# Patient Record
Sex: Female | Born: 1945 | Race: White | Hispanic: No | Marital: Married | Smoking: Current every day smoker
Health system: Southern US, Community
[De-identification: ages and names within clinical notes are randomized; demographics above are authoritative.]

## PROBLEM LIST (undated history)

## (undated) DIAGNOSIS — E119 Type 2 diabetes mellitus without complications: Secondary | ICD-10-CM

## (undated) DIAGNOSIS — G629 Polyneuropathy, unspecified: Secondary | ICD-10-CM

## (undated) DIAGNOSIS — G894 Chronic pain syndrome: Secondary | ICD-10-CM

## (undated) DIAGNOSIS — K219 Gastro-esophageal reflux disease without esophagitis: Secondary | ICD-10-CM

## (undated) DIAGNOSIS — Z720 Tobacco use: Secondary | ICD-10-CM

## (undated) DIAGNOSIS — E785 Hyperlipidemia, unspecified: Secondary | ICD-10-CM

## (undated) DIAGNOSIS — I2510 Atherosclerotic heart disease of native coronary artery without angina pectoris: Secondary | ICD-10-CM

## (undated) DIAGNOSIS — J449 Chronic obstructive pulmonary disease, unspecified: Secondary | ICD-10-CM

## (undated) HISTORY — DX: Type 2 diabetes mellitus without complications: E11.9

## (undated) HISTORY — DX: Polyneuropathy, unspecified: G62.9

## (undated) HISTORY — DX: Tobacco use: Z72.0

## (undated) HISTORY — DX: Hyperlipidemia, unspecified: E78.5

## (undated) HISTORY — DX: Gastro-esophageal reflux disease without esophagitis: K21.9

## (undated) HISTORY — DX: Chronic pain syndrome: G89.4

## (undated) HISTORY — DX: Atherosclerotic heart disease of native coronary artery without angina pectoris: I25.10

## (undated) HISTORY — DX: Chronic obstructive pulmonary disease, unspecified: J44.9

---

## 1997-12-13 ENCOUNTER — Other Ambulatory Visit: Admission: RE | Admit: 1997-12-13 | Discharge: 1997-12-13 | Payer: Self-pay

## 1999-10-27 ENCOUNTER — Ambulatory Visit (HOSPITAL_COMMUNITY): Admission: RE | Admit: 1999-10-27 | Discharge: 1999-10-27 | Payer: Self-pay | Admitting: Gastroenterology

## 1999-10-30 ENCOUNTER — Encounter: Payer: Self-pay | Admitting: Gastroenterology

## 1999-10-30 ENCOUNTER — Ambulatory Visit (HOSPITAL_COMMUNITY): Admission: RE | Admit: 1999-10-30 | Discharge: 1999-10-30 | Payer: Self-pay | Admitting: Gastroenterology

## 2000-01-05 ENCOUNTER — Other Ambulatory Visit: Admission: RE | Admit: 2000-01-05 | Discharge: 2000-01-05 | Payer: Self-pay | Admitting: *Deleted

## 2000-01-30 ENCOUNTER — Observation Stay (HOSPITAL_COMMUNITY): Admission: RE | Admit: 2000-01-30 | Discharge: 2000-01-31 | Payer: Self-pay | Admitting: Specialist

## 2000-01-30 ENCOUNTER — Encounter (INDEPENDENT_AMBULATORY_CARE_PROVIDER_SITE_OTHER): Payer: Self-pay | Admitting: Specialist

## 2000-01-30 ENCOUNTER — Encounter: Payer: Self-pay | Admitting: Specialist

## 2002-09-22 ENCOUNTER — Encounter: Payer: Self-pay | Admitting: Internal Medicine

## 2002-09-22 ENCOUNTER — Encounter: Payer: Self-pay | Admitting: Radiology

## 2002-09-22 ENCOUNTER — Encounter: Admission: RE | Admit: 2002-09-22 | Discharge: 2002-09-22 | Payer: Self-pay | Admitting: Internal Medicine

## 2002-10-10 ENCOUNTER — Encounter: Admission: RE | Admit: 2002-10-10 | Discharge: 2002-10-10 | Payer: Self-pay | Admitting: Internal Medicine

## 2002-10-10 ENCOUNTER — Encounter: Payer: Self-pay | Admitting: Internal Medicine

## 2002-11-07 ENCOUNTER — Encounter: Payer: Self-pay | Admitting: Internal Medicine

## 2002-11-07 ENCOUNTER — Encounter: Admission: RE | Admit: 2002-11-07 | Discharge: 2002-11-07 | Payer: Self-pay | Admitting: Internal Medicine

## 2003-03-27 HISTORY — PX: PERCUTANEOUS CORONARY STENT INTERVENTION (PCI-S): SHX6016

## 2008-11-12 ENCOUNTER — Ambulatory Visit (HOSPITAL_COMMUNITY): Admission: RE | Admit: 2008-11-12 | Discharge: 2008-11-12 | Payer: Self-pay | Admitting: Internal Medicine

## 2010-05-01 LAB — GLUCOSE, CAPILLARY: Glucose-Capillary: 196 mg/dL — ABNORMAL HIGH (ref 70–99)

## 2010-06-13 NOTE — Procedures (Signed)
[facility]  Patient:    Elaine Henson, Elaine Henson                     MRN: [id number] Proc. Date: 10/27/99 Adm. Date:  [id number] Attending:  Louie Bun CC:         Monica Becton, M.D.   Procedure Report  PROCEDURE PERFORMED:  Esophagogastroduodenoscopy.  INDICATIONS FOR PROCEDURE:  Abdominal pain with negative hepatobiliary work-up and failure to respond to proton pump inhibitor.  DESCRIPTION OF PROCEDURE:  The patient was placed in the left lateral decubitus position and placed on the pulse monitor with continuous low-flow oxygen delivered by nasal cannula.  She was sedated with 50 mg of IV Demerol and 5 mg of IV Versed.  The Olympus video endoscope was advanced under direct vision into the oropharynx and esophagus.  The esophagus was straight and of normal caliber at the squamocolumnar line at 38 cm.  There was no visible hiatal hernia, ring stricture, or other abnormality of the GE junction.  The stomach was entered and a small amount of liquid secretions were suctioned from the fundus.  Retroflexed view of the cardia was unremarkable.  The fundus, body, antrum and pylorus all appeared normal.  The duodenum was entered and both bulb and second portion were well inspected and appeared to be within normal limits.  The scope was then withdrawn and the patient returned to the recovery room in stable condition.  She tolerated the procedure well and there were no immediate complications.  IMPRESSION:  Essentially normal endoscopy.  PLAN:  Given the association of right leg pain and weakness, with her abdominal pain we will obtain an MRI of the spine.  If this is unrevealing we will consider possible PIPIDA scan given her that she had a small echogenic area thought to represent a small amount of sludge on her ultrasound. DD:  10/27/99 TD:  10/28/99 Job: [id number]

## 2010-06-13 NOTE — H&P (Signed)
[facility]  Patient:    Elaine Henson, Elaine Henson                       MRN: [id number] Adm. Date:  01/30/00 Attending:  Javier Docker, M.D. Dictator:   Dorie Rank, P.A.-C. CC:         Dr. Montey Hora, [place], [place].   History and Physical  DATE OF BIRTH:  1945-04-12  CHIEF COMPLAINT:  Right lower extremity and back pain.  HISTORY OF PRESENT ILLNESS:  Ms. [name] has been having back pain which started suddenly on September 24 without any known injury. She was seen and evaluated in our office after being referred by her family medical doctor in [place], [place], Dr. Montey Hora. She has undergone trials of epidural steroid injections at the L5-S1 interspace on my right and did only get minimal relief. She was tried on nonsteroids and various types of pain medicine which proved to relieve her pain. She had an MRI on October 30, 1999 which revealed right lower extremity radiculopathy secondary to neuroforaminal stenosis at l5 on my right. Also underlying degenerative disk disease at L5-S1. Neuroforaminal narrowing at L4-5 and L5-S1 on the left and a small parous central disk herniation at L5-S1 on the left.  On physical exam, it was noted that she walks with an antalgic gait. She had some discomfort with end forward flexion as well as with end extension. She was nontender at the trochanters. Straight leg raise on my right produces marked buttocks, posterior thigh and calf pain which is exacerbated with dorsilog mentation maneuvers. She had slight groin pain with internal external rotation of her head, diminished sensation at the L5 dermatome. ______ this Babinski or clonus. Motor was 5/5. Sensation was increased slightly at the L5 dermatome. The pain was getting to where it was inferring with her activities of daily living and she could no longer go to work due to the pain in her back. After a long discussion of the pathology and real anatomy and  treatment options and given the persistence of her symptoms, it was mutually decided to proceed with a lumbar decompression at L5-S1 to include foraminotomy and L5 evaluation and diskectomy of L5-S1. The risks and benefits were discussed and the procedure was described in detail. She agreed to proceed and the surgery was scheduled.  PAST MEDICAL HISTORY:  History of anxiety and depression. Diabetes mellitus type 2 diagnosed x approximately 1 year ago. History of urinary incontinence. The patient says she has gastroesophageal reflux disease and she has been evaluated by a gastroenterologist, Dr. Madilyn Fireman for her recent weight loss in the past 6 months. She does have a history of arthritis in her low back. History of emphysema and hypercholesterolemia. The patient states she has had hepatomegaly which her medical doctor has followed up on ultrasound with. She states that her liver enzymes have been normal although we do not have those today.  SOCIAL HISTORY:  The patient is engaged. She denies any alcohol use. She smokes approximately a 1/2 a pack per day. Prior to September 24, she worked at [employer] in [place], [place]. She has 2 children and 1 grandchild who she takes care of.  PAST SURGICAL HISTORY:  1990 hysterectomy, 1989 rotator cuff repair, 1978 tubal ligation.  ALLERGIES:  No known drug allergies.  MEDICATIONS:  Premarin 0.65 mg 1 p.o. q.d., Nexium 40 mg 1 p.o. q.d., lorazepam 0.5 mg 1 p.o. q. 6h p.r.n. anxiety,  amaryl 2 mg 1 p.o. q.d.  FAMILY HISTORY:  Mother deceased at age 68 history of colon cancer, father deceased at age 57 gunshot wounds, does have a history of heart disease in 3 of her brothers.  REVIEW OF SYSTEMS:  HEENT:  Positive for does have some occasional frontal and parietal headaches. No otorrhea, no rhinorrhea. No sore throat. CARDIOVASCULAR:  Occasional "skipped beat", negative for angina, palpations. PULMONARY:  Does have shortness  of breath and dyspnea on exertion. States that she does wheeze. ENDOCRINE: The patient is a type 2 diabetic x 1 year, no hypo or hyper thyroidism. CENTRAL NERVOUS SYSTEM:  No dizziness, vertigo, history of strokes, facial drooping or seizures. GASTROINTESTINAL:  Does have some diarrhea. No constipation, no melena, no hematochezia, no hematemesis. GENITOURINARY:  Does have a history of urinary incontinence. No dysuria or increase in frequency. No urinary tract infections, no hematuria. MUSCULOSKELETAL:  Does have arthritis of the low back. No other joint pain or weakness, does have some numbness and tingling to the right leg as described above.  PHYSICAL EXAMINATION:  GENERAL:  A 65 year old white female with a depressed affect.  VITAL SIGNS:  Pulse 78, respirations 20, blood pressure 110/60.  HEENT:  Head is normocephalic, atraumatic. The patient is wearing glasses today. Her oropharynx is not erythematous. PERRLA. Extraocular movements intact.  NECK:  Supple, negative for carotid bruits.  CHEST:  Lungs are clear to auscultation bilaterally. Negative for wheezes, rhonchi or rales.  HEART: S1, S2, grade 3/6 systolic ejection murmur ______ pulmonic and aortic area.  BREASTS/GENITOURINARY:  Not pertinent to present illness.  ABDOMEN:  There is some mild epigastric tenderness noted on palpitations. Positive bowel sounds. Abdomen is soft and round.  EXTREMITIES:  2+ dorsalis pedis pulse bilaterally, 2+ posterior tibialis pulses bilaterally. Please see history of present illness for description of the physical exam to the lower extremities.  SKIN:  Acyanotic, no clubbing noted. There is a small healing ulcer to the dorsum of the left hand approximately 2 mm x 2 mm with no bleeding or oozing or erythema noted to the area.  LABORATORY DATA AND X-RAYS:  Preop labs and x-rays are pending at this time.  IMPRESSION:  1. Herniated nucleus pulposus L5-S1, spinal stenosis.  2. Anxiety  and depression.  3. Diabetes mellitus type 2.   4. Urinary incontinence.  5. Gastroesophageal reflux disease.  6. Arthritis.  7. Hepatomegaly.  8. Emphysema.  9. Hypercholesterolemia. 10. History of heart murmur.  PLAN:  The patient is scheduled for a bilateral hemilaminectomy with microdiskectomy. L5-S1 foraminotomy L5 on the right with Dr. Jene Every on January 30, 2000 at [facility]. DD:  01/26/00 TD:  01/26/00 Job: [id number]

## 2010-06-13 NOTE — Op Note (Signed)
[facility]  Patient:    Elaine Henson, Elaine Henson                     MRN: [id number] Proc. Date: 01/30/00 Adm. Date:  [id number] Attending:  Pierce Crane                           Operative Report  PREOPERATIVE DIAGNOSES: 1. Herniated nucleus pulposus L5-S1 bilaterally. 2. Right L5 foraminal stenosis.  POSTOPERATIVE DIAGNOSES: 1. Herniated nucleus pulposus L5-S1 bilaterally. 2. Right L5 foraminal stenosis.  OPERATION: 1. Bilateral hemilaminotomy, 2. Bilateral microdiskectomy and foraminotomy L5 right.  SURGEON:  Javier Docker, M.D.  ANESTHESIA:  General.  BRIEF HISTORY AND INDICATIONS:  A 65 year old with bilateral lower extremity radiculopathy, right greater than left, HNP at L5-S1, foraminal stenosis at L5 on the right, lumbar spondylosis with associated disk degeneration.  Surgery is indicated to decompress the S1 nerve roots and L5 nerve roots on the right.  Risks and benefits of the procedure were discussed including bleeding, infection, damage to neurovascular structures, CSF leakage, no change in symptoms, worsening of symptoms, need for fusion in the future, no change in her back pain, discussed predominantly for leg pain.  TECHNIQUE:  The patient was placed in the supine position after induction of adequate general anesthesia with 1 g Kefzol IV for prophylaxis.  The patient was placed prone on the [place] frame.  All bony prominences were well padded.  The lumbar region was prepped and draped in the usual sterile fashion.  Two 18-gauge spinal needles were utilized to localize the L5-S1 interspace and confirmed with x-ray.  Decision was made from the spinous process of L5 to S1.  Subcutaneous tissue was dissected.  Electrocautery was utilized to achieve hemostasis.  First, the lumbar fascia was identified and divided in line with the skin incision.  Paraspinous muscles were gently elevated from the lamina of L5-S1 bilaterally.   McCullough retractors were placed.  The operating microscope was draped and brought into the surgical field.  Penfield 4 was placed in the interlaminar space, and this confirmed by x-ray the L5-S1 interspace.   High-speed bur was utilized to perform a hemilaminotomy of the L5 which was performed with 2 mm Kerrison.  Ligamentum flavum was detached from the cephalad edge of S1.  Hemilaminotomy of S1 was performed as was a foraminotomy.  Ligamentum flavum was removed from the interspace after neural elements were protected on all sides with a DErrico neural patties.   Small focal HNP was noted compressing the S1 nerve root.  The nerve root gently was retracted medially.  Annulotomy was performed.  Focal disruption was removed. Disk space was extremely degenerated.  I was unable to enter the disk space due to collapse.  The foramen of S1 and the axilla of S1 were without evidence of neural compression.  A foraminotomy was performed at L5 with 2 mm Kerrison after hockey stick probe revealed it was severely constricted, depressing the L5 root.  Extensive foraminotomy was performed with partial medial hemifacetectomy decompressing the L5 nerve root.  This was found to be erythematous and edematous.  This was felt to be a significant pathology.  The foraminotomy in this region was decompressed as optimally as possibly.  The wound was copiously irrigated.  Bipolar electrocautery was utilized to achieve hemostasis.  Thombin-soaked Gelfoam was placed in the laminotomy defect.  Attempted turn to the contralateral side with similar fashion.  The interlaminar  space was opened.  Hemilaminotomies at L5 and S1 were performed. S1 nerve root identified at the foramen, gently retracted medially.  Focal disk herniation noted.  Annulotomy performed and herniated disk material removed.  Again, the disk space was not able to be entered due to approximation of the disk space due to degeneration.  Bur placed in  the foramen at L5-S1 and found to be widely patent.  Residual nerve compression noted on left after the diskectomy.  Bipolar electrocautery utilized to achieve hemostasis.  Wound copiously irrigated.  No active bleeding or CSF leak.  Thrombin-soaked Gelfoam was placed in laminotomy defect.  Next, operating microscope was removed as was the [place].  Paraspinous muscles were checked with no active bleeding.  Dorsal lumbar fascia was reapproximated with #1 Vicryl figure-of-eight sutures.  Subcutaneous tissue was reapproximated with 2-0 Vicryl sutures.  Skin was reapproximated with  3-0 subcuticular Prolene.  Sterile dressing applied.   The patient was placed supine on the hospital bed, extubated without difficulty, and transported to the recovery room in satisfactory condition.  The patient tolerated the procedure well without complication.  Sponge, needle, and sharp counts correct.   Blood loss was minimal. DD:  01/30/00 TD:  01/30/00 Job: [id number]

## 2011-06-11 IMAGING — CT NM PET TUM IMG INITIAL (PI) SKULL BASE T - THIGH
6 series · 25 of 25 positions shown · IV contrast (350 OM)
Comparison: CT chest from [facility] [[facility]] dated
10/31/2008.

CLINICAL DATA: Initial treatment strategy for pulmonary nodule.

NUCLEAR MEDICINE PET CT INITIAL (PI) SKULL BASE TO THIGH
TECHNIQUE: 13.4 mCi F-18 FDG was injected intravenously via the
right antecubital fossa.  Full-ring PET imaging was performed from
the skull base through the mid-thighs 75  minutes after injection.
CT data was obtained and used for attenuation correction and
anatomic localization only.  (This was not acquired as a diagnostic
CT examination.)
Fasting Blood Glucose:  196

[Series 1: pet ac · axial · 3.3mm · 4.69mm/px · z∈[-868,+2]mm · 5 of 267 slices shown]
[im 1/267]
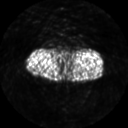
[im 67/267]
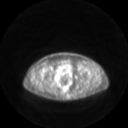
[im 134/267]
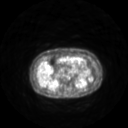
[im 200/267]
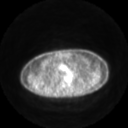
[im 267/267]
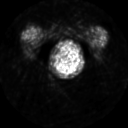

[Series 2: pet nac · axial · 3.3mm · 4.69mm/px · z∈[-868,+2]mm · 6 of 267 slices shown]
[im 1/267]
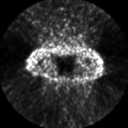
[im 54/267]
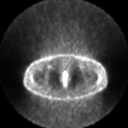
[im 107/267]
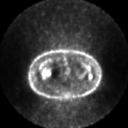
[im 160/267]
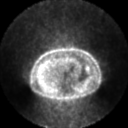
[im 213/267]
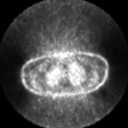
[im 267/267]
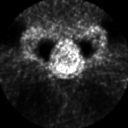

[Series 2: ct images · axial · 3.8mm · 0.98mm/px · z∈[-868,+2]mm · 5 of 267 slices shown]
[im 1/267]
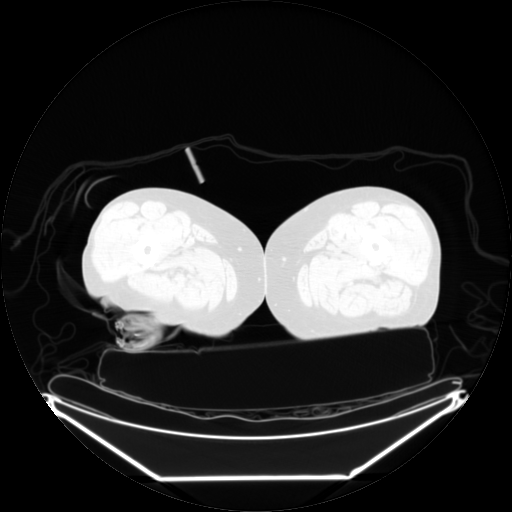
[im 67/267]
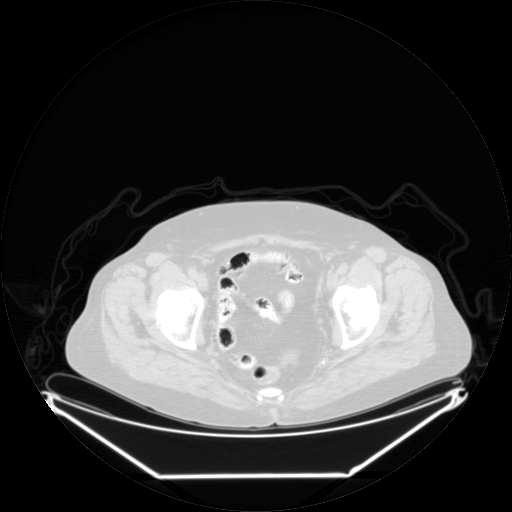
[im 134/267]
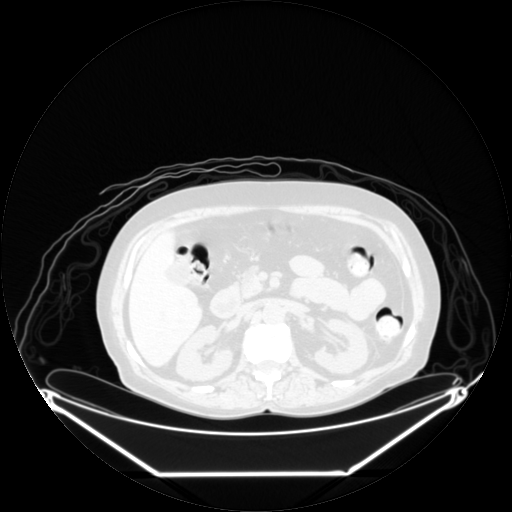
[im 200/267]
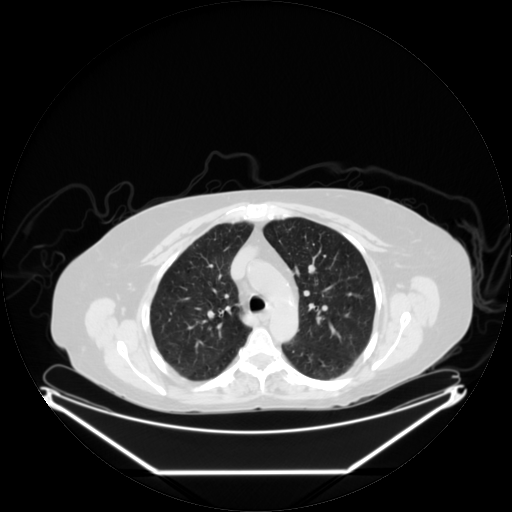
[im 267/267  brain]
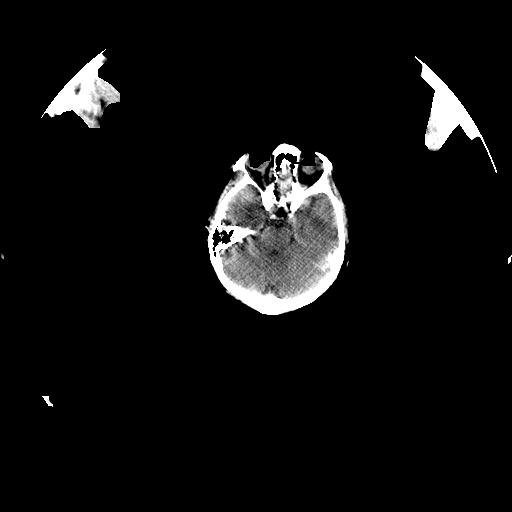

[Series 123: mip · coronal · 3.3mm · 4.69mm/px · 1 of 30 slices shown]
[im 1/30]
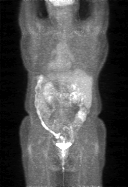

[Series 151: reformatted · axial · 3.3mm · 3.91mm/px · z∈[-868,+2]mm · 6 of 267 slices shown (1 of 2)]
[im 1/267]
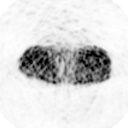
[im 54/267]
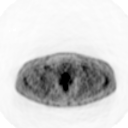
[im 107/267]
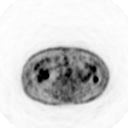
[im 160/267]
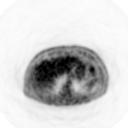
[im 213/267]
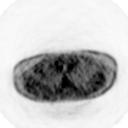
[im 267/267]
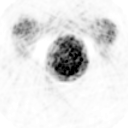

[Series 153: reformatted · coronal · 4.7mm · 6.98mm/px · 2 of 79 slices shown (2 of 2)]
[im 1/79]
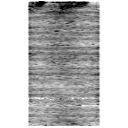
[im 79/79]
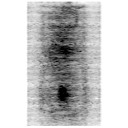

[25 of 25 positions shown; findings below may reference images not displayed]

FINDINGS: No suspicious or unexpected foci of F D G uptake are
identified in the neck, chest, abdomen, or pelvis.

Specifically, the 9 mm posterior left lower lobe pulmonary nodules
seen on the recent CT scan shows no discernible hypermetabolic
activity on today's PET CT.  While reassuring, the 9 mm size of
this nodule is borderline for accepted resolution by PET CT
imaging, especially if this represents a low grade or well
differentiated neoplasm.  As such, continued imaging follow-up is
recommended.

No discernible hypermetabolic activity can be identified within the
4 mm posterior right upper lobe pulmonary nodules seen on the
previous CT scan.  However, at this tiny size, this is well below
the accepted threshold for reliable resolution by PET CT imaging.

A small focus of F D G accumulation is seen in the region of the
urethra.  The patient has a demonstrable cystocele on the CT
imaging and the activity in the urethra may be related to
contaminants or a urethral diverticulum.  CT images obtained for
attenuation correction show coronary artery calcification.
IMPRESSION: No hypermetabolic activity is identified within either of the
pulmonary nodules identified on the recent chest CT at [facility]
[[facility]OS[facility]].  The larger of the two nodules is 9 mm which is
essentially borderline for reliable resolution by PET CT imaging.
While the lack of hypermetabolic activity in this nodule is
reassuring, nodules of this size can present with false negative
results, especially in the setting of a well differentiated or low
grade neoplasm.  CT of the chest in 3 months is recommended to
confirm size stability.  This study could be performed without
intravenous contrast material. The tiny right lung nodule could
also be reassessed at that time.

## 2015-06-13 ENCOUNTER — Encounter: Payer: Self-pay | Admitting: *Deleted

## 2015-06-20 ENCOUNTER — Ambulatory Visit: Payer: Self-pay | Admitting: Cardiology

## 2015-06-26 ENCOUNTER — Ambulatory Visit (INDEPENDENT_AMBULATORY_CARE_PROVIDER_SITE_OTHER): Payer: Medicare HMO | Admitting: Cardiovascular Disease

## 2015-06-26 ENCOUNTER — Encounter: Payer: Self-pay | Admitting: Cardiovascular Disease

## 2015-06-26 ENCOUNTER — Encounter: Payer: Self-pay | Admitting: *Deleted

## 2015-06-26 VITALS — BP 101/59 | HR 48 | Ht 66.0 in | Wt 127.0 lb

## 2015-06-26 DIAGNOSIS — E785 Hyperlipidemia, unspecified: Secondary | ICD-10-CM

## 2015-06-26 DIAGNOSIS — Z955 Presence of coronary angioplasty implant and graft: Secondary | ICD-10-CM

## 2015-06-26 DIAGNOSIS — I252 Old myocardial infarction: Secondary | ICD-10-CM

## 2015-06-26 DIAGNOSIS — I25118 Atherosclerotic heart disease of native coronary artery with other forms of angina pectoris: Secondary | ICD-10-CM

## 2015-06-26 DIAGNOSIS — Z136 Encounter for screening for cardiovascular disorders: Secondary | ICD-10-CM | POA: Diagnosis not present

## 2015-06-26 DIAGNOSIS — Z716 Tobacco abuse counseling: Secondary | ICD-10-CM

## 2015-06-26 NOTE — Patient Instructions (Signed)
Continue all current medications. Your physician wants you to follow up in: 6 months.  You will receive a reminder letter in the mail one-two months in advance.  If you don't receive a letter, please call our office to schedule the follow up appointment   

## 2015-06-26 NOTE — Progress Notes (Signed)
Patient ID: Elaine Henson, female   DOB: 28-May-1945, 70 y.o.   MRN: [id number]       CARDIOLOGY CONSULT NOTE  Patient ID: Elaine Henson MRN: [id number] DOB/AGE: March 14, 1945 70 y.o.  Admit date: (Not on file) Primary Physician: Ignatius Specking, MD Referring Physician: Sherril Croon MD  Reason for Consultation: CAD  HPI: The patient is a 70 year old woman with a history of coronary artery disease, non-STEMI, PCI, chronic tobacco abuse, and insulin-dependent diabetes mellitus.  She sustained a non ST segment elevation myocardial infarction in March 2005 (Cypher drug eluting stent to circumflex and POBA of OM2, borderline obstructive disease in LAD and borderline disease in RCA).   Nuclear myocardial perfusion study on 08/23/09 showed no evidence of ischemia or scar.  ECG performed in the office today which I personally reviewed demonstrates normal sinus bradycardia with no ischemic ST segment or T-wave abnormalities, nor any arrhythmias.  Lipids 11/24/14 showed total cholesterol 126, triglycerides 177, HDL 32, LDL 59.  She has had 2 episodes of chest pain requiring nitroglycerin use in the past 6 months. She said she had another heart attack 2 years ago and was hospitalized at [facility]. She says she has 3 stents and not 1, all of which were placed in 2005.  She has easy bruisability. She continues to smoke a half pack of cigarettes daily and has chronic exertional dyspnea related to this. She has been smoking since she was 70 years old. She complains of occasional night sweats. I do not have a copy of her cath report from 2005.  Allergies  Allergen Reactions  . Metformin And Related Diarrhea    Current Outpatient Prescriptions  Medication Sig Dispense Refill  . ALPRAZolam (XANAX) 1 MG tablet Take 1 mg by mouth 3 (three) times daily as needed.    Marland Kitchen aspirin EC 81 MG tablet Take 81 mg by mouth daily.    . benazepril (LOTENSIN) 5 MG tablet Take 5 mg by mouth daily.    . clopidogrel (PLAVIX) 75 MG  tablet Take 75 mg by mouth daily.    . Fluticasone-Salmeterol (ADVAIR DISKUS) 250-50 MCG/DOSE AEPB Inhale 1 puff into the lungs 2 (two) times daily.    Marland Kitchen glimepiride (AMARYL) 4 MG tablet Take 4 mg by mouth 2 (two) times daily.    Marland Kitchen HYDROcodone-acetaminophen (NORCO) 7.5-325 MG tablet Take 1 tablet by mouth 3 (three) times daily as needed for moderate pain.    . Insulin Detemir (LEVEMIR FLEXPEN) 100 UNIT/ML Pen Inject 40 Units into the skin daily.    . metoprolol tartrate (LOPRESSOR) 25 MG tablet Take 25 mg by mouth daily.    . mirabegron ER (MYRBETRIQ) 25 MG TB24 tablet Take 25 mg by mouth daily.    . nitroGLYCERIN (NITROSTAT) 0.4 MG SL tablet Place 0.4 mg under the tongue every 5 (five) minutes as needed for chest pain.    . pantoprazole (PROTONIX) 40 MG tablet Take 40 mg by mouth 2 (two) times daily.    . sitaGLIPtin (JANUVIA) 100 MG tablet Take 100 mg by mouth daily.     No current facility-administered medications for this visit.    Past Medical History  Diagnosis Date  . Neuropathy (HCC)   . GERD (gastroesophageal reflux disease)   . Diabetes (HCC)   . COPD (chronic obstructive pulmonary disease) (HCC)   . Tobacco abuse   . Hyperlipidemia   . Chronic pain syndrome   . CAD (coronary artery disease)     [facility] H&P: s/p non  ST segment elevation myocardial infarction in March, 2005 (Cypher drug eluting stent to circumflex and POBA of OM2, borderline obstructive disease in LAD end borderline disease in RCA) O escalation of angina    No past surgical history on file.  Social History   Social History  . Marital Status: Married    Spouse Name: N/A  . Number of Children: N/A  . Years of Education: N/A   Occupational History  . Not on file.   Social History Main Topics  . Smoking status: Current Every Day Smoker -- 0.75 packs/day    Types: Cigarettes  . Smokeless tobacco: Not on file  . Alcohol Use: Not on file  . Drug Use: Not on file  . Sexual Activity: Not on file    Other Topics Concern  . Not on file   Social History Narrative     No family history of premature CAD in 1st degree relatives.  Prior to Admission medications   Medication Sig Start Date End Date Taking? Authorizing Provider  ALPRAZolam Prudy Feeler(XANAX) 1 MG tablet Take 1 mg by mouth 3 (three) times daily as needed.    Historical Provider, MD  aspirin EC 81 MG tablet Take 81 mg by mouth daily.    Historical Provider, MD  benazepril (LOTENSIN) 5 MG tablet Take 5 mg by mouth daily.    Historical Provider, MD  clopidogrel (PLAVIX) 75 MG tablet Take 75 mg by mouth daily.    Historical Provider, MD  Fluticasone-Salmeterol (ADVAIR DISKUS) 250-50 MCG/DOSE AEPB Inhale 1 puff into the lungs 2 (two) times daily.    Historical Provider, MD  glimepiride (AMARYL) 4 MG tablet Take 4 mg by mouth 2 (two) times daily.    Historical Provider, MD  HYDROcodone-acetaminophen (NORCO) 7.5-325 MG tablet Take 1 tablet by mouth 3 (three) times daily as needed for moderate pain.    Historical Provider, MD  Insulin Detemir (LEVEMIR FLEXPEN) 100 UNIT/ML Pen Inject 40 Units into the skin daily.    Historical Provider, MD  metoprolol tartrate (LOPRESSOR) 25 MG tablet Take 25 mg by mouth daily.    Historical Provider, MD  mirabegron ER (MYRBETRIQ) 25 MG TB24 tablet Take 25 mg by mouth daily.    Historical Provider, MD  nitroGLYCERIN (NITROSTAT) 0.4 MG SL tablet Place 0.4 mg under the tongue every 5 (five) minutes as needed for chest pain.    Historical Provider, MD  pantoprazole (PROTONIX) 40 MG tablet Take 40 mg by mouth 2 (two) times daily. 12/19/12   Historical Provider, MD  sitaGLIPtin (JANUVIA) 100 MG tablet Take 100 mg by mouth daily.    Historical Provider, MD     Review of systems complete and found to be negative unless listed above in HPI     Physical exam Height 5\' 6"  (1.676 m), weight 127 lb (57.607 kg). General: NAD Neck: No JVD, no thyromegaly or thyroid nodule.  Lungs: Diminished throughout, no  rales/wheezes. CV: Nondisplaced PMI. Regular rate and rhythm, normal S1/S2, no S3/S4, no murmur.  No peripheral edema.   Abdomen: Soft, nontender, no distention.  Skin: Erythema of b/l LE's. Neurologic: Alert and oriented x 3.  Psych: Normal affect. HEENT: Normal.   ECG: Most recent ECG reviewed.  Labs:  No results found for: WBC, HGB, HCT, MCV, PLT No results for input(s): NA, K, CL, CO2, BUN, CREATININE, CALCIUM, PROT, BILITOT, ALKPHOS, ALT, AST, GLUCOSE in the last 168 hours.  Invalid input(s): LABALBU No results found for: CKTOTAL, CKMB, CKMBINDEX, TROPONINI No results found for:  CHOL No results found for: HDL No results found for: LDLCALC No results found for: TRIG No results found for: CHOLHDL No results found for: LDLDIRECT       Studies: No results found.  ASSESSMENT AND PLAN:  1. CAD with h/o NSTEMI and PCI of LCx: Stable ischemic heart disease. I will try to obtain a copy of her cath report from 2005 to verify number of stents. I will continue aspirin, Lipitor, benazepril, Plavix, and metoprolol.  2. Hyperlipidemia: Lipids from 10/2014 reviewed above. Continue Lipitor.  3. Tobacco abuse disorder: Unwilling to quit. Cessation counseling given (1 minute). Says she gets anxious if she doesn't smoke.  Dispo: fu 6 months.   Signed: Prentice Docker, M.D., F.A.C.C.  06/26/2015, 9:16 AM

## 2016-03-19 ENCOUNTER — Telehealth (HOSPITAL_COMMUNITY): Payer: Self-pay | Admitting: *Deleted

## 2016-03-19 NOTE — Telephone Encounter (Signed)
phone call to patient regarding an appointment, a female answered and when asked to speak with patient, he hung up the phone.

## 2016-04-02 ENCOUNTER — Telehealth: Payer: Self-pay | Admitting: Cardiovascular Disease

## 2016-04-02 NOTE — Telephone Encounter (Signed)
attempts to contact patient with recall letters. Unable to reach by telephone. with no success.    Megan SalonVicky T Slaughter [[id number]][[id number]] 06/26/2015 9:48 AM New [10]   [System] 08/27/2015 11:03 PM Notification Sent [20]   Megan SalonVicky T Slaughter [[id number]][[id number]] 03/24/2016 3:14 PM Notification Sent [20]   Megan SalonVicky T Slaughter [[id number]][[id number]] 04/02/2016 3:33 PM Notification Sent [20]  Scheduling Instructions  6 month

## 2016-05-28 NOTE — Progress Notes (Signed)
Psychiatric Initial Adult Assessment   Patient Identification: Elaine Henson MRN:  [id number] Date of Evaluation:  06/01/2016 Referral Source: [facility] Chief Complaint:   Chief Complaint    New Evaluation    "I'm upset"  Visit Diagnosis:    ICD-9-CM ICD-10-CM   1. Generalized anxiety disorder 300.02 F41.1     History of Present Illness:   Elaine Henson is a 71 year old female with CAD with history of NSTEMI and PCI, hyperlipidemia, tobacco use, who is referred for anxiety.   Patient states that she is here as she will not be prescribed on Xanax anymore by her PCP. She states that she feels upset as her brother's grandchild died last week due to drowning in a pool. Although she has never met this grandchild, she feels sad as she knows her he was important to her brother. She feels frustrated as she will not be able to afford to go to funeral in [place]. She talks about frustration of her son with TBI; she takes care of him and pay all the bills, although she has financial strain. She takes care of her grandson's ("my child") daughter on weekday.   She reports insomnia if she does not take Xanax. She reports good energy and denies anhedonia. She feels anxious all the time since childhood. She reports occasional palpitation with severe anxiety. She has difficulty in concentration due to anxiety. She takes Xanax three times per day for anxiety. She denies decreased need for sleep or euphoria. She reports occasional AH of noises. She drinks only on special occasion, and she denies drug use.   05/30/2016 HYDROCODONE- ACETAMIN 7.5- 325, 120 tabs for 30 days,   Sander Radon Manuela Neptune MD 05/11/2016 ALPRAZOLAM 1 MG TABLET, 90 tabs for 30 days,  BOONE ANGELA R  Associated Signs/Symptoms: Depression Symptoms:  insomnia, (Hypo) Manic Symptoms:  denies Anxiety Symptoms:  Excessive Worry, Psychotic Symptoms:  denies PTSD Symptoms: Negative  Past Psychiatric History:  Outpatient:  denies Psychiatry admission: denies Previous suicide attempt: denies Past trials of medication: fluoxetine (nausea) History of violence: denies  Previous Psychotropic Medications: Yes   Substance Abuse History in the last 12 months:  No.  Consequences of Substance Abuse: NA  Past Medical History:  Past Medical History:  Diagnosis Date  . CAD (coronary artery disease)    [facility] H&P: s/p non ST segment elevation myocardial infarction in March, 2005 (Cypher drug eluting stent to circumflex and POBA of OM2, borderline obstructive disease in LAD end borderline disease in RCA) O escalation of angina  . Chronic pain syndrome   . COPD (chronic obstructive pulmonary disease) ([place])   . Diabetes ([place])   . GERD (gastroesophageal reflux disease)   . Hyperlipidemia   . Neuropathy   . Tobacco abuse    No past surgical history on file.  Family Psychiatric History:  Denies,  Family History:  Family History  Problem Relation Age of Onset  . Colon cancer Mother     Social History:   Social History   Social History  . Marital status: Married    Spouse name: N/A  . Number of children: N/A  . Years of education: N/A   Social History Main Topics  . Smoking status: Current Every Day Smoker    Packs/day: 0.75    Types: Cigarettes    Start date: 04/28/1959  . Smokeless tobacco: Never Used  . Alcohol use None  . Drug use: Unknown  . Sexual activity: Not Asked  Other Topics Concern  . None   Social History Narrative  . 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 Metformin And Related Diarrhea    Metabolic Disorder Labs: No results found for: HGBA1C, MPG No results found for: PROLACTIN No results found for: CHOL, TRIG, HDL, CHOLHDL, VLDL,  LDLCALC   Current Medications: Current Outpatient Prescriptions  Medication Sig Dispense Refill  . albuterol (PROAIR HFA) 108 (90 Base) MCG/ACT inhaler Inhale 2 puffs into the lungs every 6 (six) hours as needed for wheezing or shortness of breath.    Marland Kitchen aspirin EC 81 MG tablet Take 81 mg by mouth daily.    Marland Kitchen atorvastatin (LIPITOR) 20 MG tablet Take 1 tablet by mouth daily.    . benazepril (LOTENSIN) 5 MG tablet Take 5 mg by mouth daily.    . calcium carbonate (OSCAL) 1500 (600 Ca) MG TABS tablet Take 600 mg of elemental calcium by mouth 2 (two) times daily with a meal.    . clopidogrel (PLAVIX) 75 MG tablet Take 75 mg by mouth daily.    . Fluticasone-Salmeterol (ADVAIR DISKUS) 250-50 MCG/DOSE AEPB Inhale 1 puff into the lungs 2 (two) times daily.    Marland Kitchen glimepiride (AMARYL) 4 MG tablet Take 4 mg by mouth 2 (two) times daily.    Marland Kitchen HYDROcodone-acetaminophen (NORCO) 7.5-325 MG tablet Take 1 tablet by mouth 3 (three) times daily as needed for moderate pain.    . Insulin Detemir (LEVEMIR FLEXPEN) 100 UNIT/ML Pen Inject 40 Units into the skin daily.    . metoprolol (LOPRESSOR) 50 MG tablet Take 0.5 tablets by mouth 2 (two) times daily.    . mirabegron ER (MYRBETRIQ) 25 MG TB24 tablet Take 25 mg by mouth daily.    . nitroGLYCERIN (NITROSTAT) 0.4 MG SL tablet Place 0.4 mg under the tongue every 5 (five) minutes as needed for chest pain.    Marland Kitchen oxymetazoline (AFRIN) 0.05 % nasal spray Place 1 spray into both nostrils 2 (two) times daily.    . pantoprazole (PROTONIX) 40 MG tablet Take 40 mg by mouth 2 (two) times daily.    . sitaGLIPtin (JANUVIA) 100 MG tablet Take 100 mg by mouth daily.    . vitamin B-12 (CYANOCOBALAMIN) 500 MCG tablet Take 500 mcg by mouth daily.    Marland Kitchen LORazepam (ATIVAN) 1 MG tablet 1-1.5 mg three times a day as needed for anxiety 135 tablet 0  . sertraline (ZOLOFT) 50 MG tablet 25 mg daily for two weeks, then 50 mg daily 30 tablet 1   No current facility-administered medications for  this visit.     Neurologic: Headache: No Seizure: No Paresthesias:No  Musculoskeletal: Strength & Muscle Tone: within normal limits Gait & Station: normal Patient leans: N/A  Psychiatric Specialty Exam: Review of Systems  Musculoskeletal: Positive for back pain.  Psychiatric/Behavioral: Positive for depression and hallucinations. Negative for substance abuse and suicidal ideas. The patient is nervous/anxious and has insomnia.   All other systems reviewed and are negative.   Blood pressure (!) 123/52, pulse 75, height '5\' 6"'  (1.676 m), weight 137 lb 6.4 oz (62.3 kg).Body mass index is 22.18 kg/m.  General Appearance: Fairly Groomed  Eye Contact:  Good  Speech:  Clear and Coherent  Volume:  Normal  Mood:  "miserable"  Affect:  Tearful and down  Thought Process:  Coherent and Goal Directed  Orientation:  Full (Time, Place, and Person)  Thought Content:  Logical Perceptions: AH of noise, denies VH  Suicidal Thoughts:  No  Homicidal Thoughts:  No  Memory:  Immediate;   Good Recent;   Good Remote;   Good  Judgement:  Fair  Insight:  Fair  Psychomotor Activity:  Normal  Concentration:  Concentration: Good and Attention Span: Good  Recall:  Good  Fund of Knowledge:Good  Language: Good  Akathisia:  No  Handed:  Right  AIMS (if indicated):  N/A  Assets:  Communication Skills Desire for Improvement  ADL's:  Intact  Cognition: WNL  Sleep:  poor   Assessment Elaine Henson is a 71 year old female with CAD with history of NSTEMI and PCI, COPD, chronic pain, hyperlipidemia, tobacco use, who is referred for anxiety.  # Generalized anxiety disorder Patient endorses anxiety and slightly worsening in her depressed mood since the recent death of the grandson of her brother. Will start sertraline to target her mood. Will switch from Xanax to lorazepam given she would benefit from longer half life for her anxiety and also to avoid risk of dependence. She is advised to contact the  clinic if she develops any withdrawal symptoms such as tremors, diaphoresis. Discussed side effect of fall, dependence and respiratory suppression especially with concomitant use of opioids. She will greatly benefit from supportive therapy to process loss; will make referral in this clinic.   Plan 1. Start sertraline 25 mg daily for two weeks, then 50 mg daily 2. Discontinue Xanax 3. Start Ativan 1-1.5 mg three times a day as needed for anxiety 4. Referral to therapy with Ms. Peggy 5. Return to clinic in one month  The patient demonstrates the following risk factors for suicide: Chronic risk factors for suicide include: psychiatric disorder of anxiety and chronic pain. Acute risk factors for suicide include: family or marital conflict, unemployment and social withdrawal/isolation. Protective factors for this patient include: responsibility to others (children, family), coping skills and hope for the future. Considering these factors, the overall suicide risk at this point appears to be low. Patient is appropriate for outpatient follow up.   Treatment Plan Summary: Plan as above   Norman Clay, MD 5/7/20182:50 PM

## 2016-06-01 ENCOUNTER — Encounter (HOSPITAL_COMMUNITY): Payer: Self-pay | Admitting: Psychiatry

## 2016-06-01 ENCOUNTER — Ambulatory Visit (INDEPENDENT_AMBULATORY_CARE_PROVIDER_SITE_OTHER): Payer: Medicare Other | Admitting: Psychiatry

## 2016-06-01 VITALS — BP 123/52 | HR 75 | Ht 66.0 in | Wt 137.4 lb

## 2016-06-01 DIAGNOSIS — F411 Generalized anxiety disorder: Secondary | ICD-10-CM | POA: Insufficient documentation

## 2016-06-01 DIAGNOSIS — I2510 Atherosclerotic heart disease of native coronary artery without angina pectoris: Secondary | ICD-10-CM | POA: Diagnosis not present

## 2016-06-01 DIAGNOSIS — Z7982 Long term (current) use of aspirin: Secondary | ICD-10-CM

## 2016-06-01 DIAGNOSIS — J449 Chronic obstructive pulmonary disease, unspecified: Secondary | ICD-10-CM | POA: Diagnosis not present

## 2016-06-01 DIAGNOSIS — Z79899 Other long term (current) drug therapy: Secondary | ICD-10-CM

## 2016-06-01 DIAGNOSIS — E785 Hyperlipidemia, unspecified: Secondary | ICD-10-CM | POA: Diagnosis not present

## 2016-06-01 DIAGNOSIS — R52 Pain, unspecified: Secondary | ICD-10-CM | POA: Diagnosis not present

## 2016-06-01 DIAGNOSIS — F17210 Nicotine dependence, cigarettes, uncomplicated: Secondary | ICD-10-CM

## 2016-06-01 MED ORDER — LORAZEPAM 1 MG PO TABS: ORAL_TABLET | ORAL | 0 refills | Status: AC

## 2016-06-01 MED ORDER — SERTRALINE HCL 50 MG PO TABS: ORAL_TABLET | ORAL | 1 refills | Status: DC

## 2016-06-01 NOTE — Patient Instructions (Addendum)
1. Start sertraline 25 mg daily for two weeks, then 50 mg daily 2. Discontinue Xanax 3. Start Ativan 1-1.5 mg three times a day as needed for anxiety 4. Referral to therapy with Ms. Peggy 5. Return to clinic in one month

## 2016-06-08 ENCOUNTER — Telehealth (HOSPITAL_COMMUNITY): Payer: Self-pay | Admitting: *Deleted

## 2016-06-08 NOTE — Telephone Encounter (Signed)
Discussed with patient. She states that she felt drunk after starting the medication. She states that she discontinued all of her medication since last Friday as she did not like the way she felt. She has not taken Xanax and she needs to "live with it (anxiety)." While she was asked questions whether she felt drunk after taking Ativan or sertraline, she hang up a phone, stating that she needs to go.   *Please follow up with the patient whether she would like to have earlier appointment to discuss about her medication.

## 2016-06-08 NOTE — Telephone Encounter (Signed)
Pt called stating she stopped taking her medications Dr. Vanetta ShawlHisada put her on this weekend. Per pt chart, pt initial appt with provider was 05-30-2016. Per pt, she was token off her Xanax and put on Zoloft and something else and can not remember the name of that medication. Per pt she was out this weekend mowing her lawn and she done almost pass out. Per pt she was like a zombie so she just stopped taking the medications Dr. Vanetta ShawlHisada put her on. Per pt the medication is not working. [phone].

## 2016-06-30 ENCOUNTER — Encounter (HOSPITAL_COMMUNITY): Payer: Self-pay | Admitting: Psychiatry

## 2016-06-30 ENCOUNTER — Ambulatory Visit (INDEPENDENT_AMBULATORY_CARE_PROVIDER_SITE_OTHER): Payer: Medicare Other | Admitting: Psychiatry

## 2016-06-30 VITALS — BP 124/60 | HR 77 | Ht 66.0 in | Wt 141.0 lb

## 2016-06-30 DIAGNOSIS — Z79891 Long term (current) use of opiate analgesic: Secondary | ICD-10-CM | POA: Diagnosis not present

## 2016-06-30 DIAGNOSIS — J449 Chronic obstructive pulmonary disease, unspecified: Secondary | ICD-10-CM

## 2016-06-30 DIAGNOSIS — Z7982 Long term (current) use of aspirin: Secondary | ICD-10-CM | POA: Diagnosis not present

## 2016-06-30 DIAGNOSIS — I252 Old myocardial infarction: Secondary | ICD-10-CM

## 2016-06-30 DIAGNOSIS — F17210 Nicotine dependence, cigarettes, uncomplicated: Secondary | ICD-10-CM | POA: Diagnosis not present

## 2016-06-30 DIAGNOSIS — G8929 Other chronic pain: Secondary | ICD-10-CM

## 2016-06-30 DIAGNOSIS — I2510 Atherosclerotic heart disease of native coronary artery without angina pectoris: Secondary | ICD-10-CM

## 2016-06-30 DIAGNOSIS — E785 Hyperlipidemia, unspecified: Secondary | ICD-10-CM

## 2016-06-30 DIAGNOSIS — F411 Generalized anxiety disorder: Secondary | ICD-10-CM

## 2016-06-30 DIAGNOSIS — Z79899 Other long term (current) drug therapy: Secondary | ICD-10-CM

## 2016-06-30 MED ORDER — DULOXETINE HCL 20 MG PO CPEP: 20.0000 mg | ORAL_CAPSULE | Freq: Every day | ORAL | 1 refills | Status: AC

## 2016-06-30 NOTE — Progress Notes (Signed)
BH MD/PA/NP OP Progress Note  06/30/2016 3:02 PM Elaine Henson  MRN:  [id number]  Chief Complaint:  Chief Complaint    Depression; Follow-up     Subjective:  "Nobody cares about me." HPI:  Patient presents for follow up appointment. She states that she feels out of it, when she takes Ativan while mowing a yard. She states that she continues to take ativan 0.5 mg TID (instructed by her pain doctor) and discontinued sertraline with concern for drowsiness. She denies any dizziness since then. She feels frustrated that she continues to feel anxious and wants to get back on Xanax, as she used to do better. She feels that nobody cares about her and feels upset. She states that her grand daughter, age 31 visits her almost everyday. Although she enjoys being with her, she occasionally feels irritable. She reports good support from people at church. She takes care of her son with TBI. She feels depressed every day. She has low energy. She denies SI. She has panic attacks a few times per week. She complains of arm pain and numbness on her hand.   Per [facility] database:  06/29/2016 HYDROCODONE- ACETAMIN 7.5- 325 by Craig Guess Al Corpus MD 06/01/2016 LORAZEPAM 1 MG TABLET 135 tabs by  Jalyric Kaestner  Visit Diagnosis:    ICD-9-CM ICD-10-CM   1. Generalized anxiety disorder 300.02 F41.1     Past Psychiatric History:  Outpatient: denies Psychiatry admission: denies Previous suicide attempt: denies Past trials of medication: fluoxetine (nausea), sertraline (dizziness) History of violence: denies  Past Medical History:  Past Medical History:  Diagnosis Date  . CAD (coronary artery disease)    [facility] H&P: s/p non ST segment elevation myocardial infarction in March, 2005 (Cypher drug eluting stent to circumflex and POBA of OM2, borderline obstructive disease in LAD end borderline disease in RCA) O escalation of angina  . Chronic pain syndrome   . COPD (chronic obstructive pulmonary disease) ([facility])   .  Diabetes ([facility])   . GERD (gastroesophageal reflux disease)   . Hyperlipidemia   . Neuropathy   . Tobacco abuse    No past surgical history on file.  Family Psychiatric History:  Denies,  Family History:  Family History  Problem Relation Age of Onset  . Colon cancer Mother     Social History:  Social History   Social History  . Marital status: Married    Spouse name: N/A  . Number of children: N/A  . Years of education: N/A   Social History Main Topics  . Smoking status: Current Every Day Smoker    Packs/day: 0.75    Types: Cigarettes    Start date: 04/28/1959  . Smokeless tobacco: Never Used  . Alcohol use None  . Drug use: Unknown  . Sexual activity: Not Asked   Other Topics Concern  . None   Social History Narrative  . 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 Metformin And Related Diarrhea    Metabolic Disorder Labs: No results found for: HGBA1C, MPG No results found for: PROLACTIN No results found for: CHOL, TRIG, HDL, CHOLHDL, VLDL, LDLCALC   Current Medications: Current Outpatient Prescriptions  Medication Sig Dispense Refill  . albuterol (PROAIR HFA) 108 (90 Base) MCG/ACT inhaler Inhale 2 puffs into the lungs  every 6 (six) hours as needed for wheezing or shortness of breath.    Marland Kitchen aspirin EC 81 MG tablet Take 81 mg by mouth daily.    Marland Kitchen atorvastatin (LIPITOR) 20 MG tablet Take 1 tablet by mouth daily.    . benazepril (LOTENSIN) 5 MG tablet Take 5 mg by mouth daily.    . calcium carbonate (OSCAL) 1500 (600 Ca) MG TABS tablet Take 600 mg of elemental calcium by mouth 2 (two) times daily with a meal.    . clopidogrel (PLAVIX) 75 MG tablet Take 75 mg by mouth daily.    . Fluticasone-Salmeterol (ADVAIR DISKUS) 250-50 MCG/DOSE AEPB Inhale 1 puff into the  lungs 2 (two) times daily.    Marland Kitchen gabapentin (NEURONTIN) 300 MG capsule Take 600 mg by mouth 3 (three) times daily.    Marland Kitchen glimepiride (AMARYL) 4 MG tablet Take 4 mg by mouth 2 (two) times daily.    Marland Kitchen HYDROcodone-acetaminophen (NORCO) 7.5-325 MG tablet Take 1 tablet by mouth 3 (three) times daily as needed for moderate pain.    . Insulin Detemir (LEVEMIR FLEXPEN) 100 UNIT/ML Pen Inject 40 Units into the skin daily.    Marland Kitchen LORazepam (ATIVAN) 1 MG tablet 1-1.5 mg three times a day as needed for anxiety 135 tablet 0  . metoprolol (LOPRESSOR) 50 MG tablet Take 0.5 tablets by mouth 2 (two) times daily.    . mirabegron ER (MYRBETRIQ) 25 MG TB24 tablet Take 25 mg by mouth daily.    . nitroGLYCERIN (NITROSTAT) 0.4 MG SL tablet Place 0.4 mg under the tongue every 5 (five) minutes as needed for chest pain.    Marland Kitchen oxymetazoline (AFRIN) 0.05 % nasal spray Place 1 spray into both nostrils 2 (two) times daily.    . pantoprazole (PROTONIX) 40 MG tablet Take 40 mg by mouth 2 (two) times daily.    . sitaGLIPtin (JANUVIA) 100 MG tablet Take 100 mg by mouth daily.    . vitamin B-12 (CYANOCOBALAMIN) 500 MCG tablet Take 500 mcg by mouth daily.    . DULoxetine (CYMBALTA) 20 MG capsule Take 1 capsule (20 mg total) by mouth daily. 30 capsule 1   No current facility-administered medications for this visit.     Neurologic: Headache: No Seizure: No Paresthesias: No  Musculoskeletal: Strength & Muscle Tone: within normal limits Gait & Station: normal Patient leans: N/A  Psychiatric Specialty Exam: Review of Systems  Musculoskeletal: Positive for back pain and neck pain.  Neurological: Positive for sensory change.  Psychiatric/Behavioral: Positive for depression. Negative for hallucinations, substance abuse and suicidal ideas. The patient is nervous/anxious and has insomnia.   All other systems reviewed and are negative.   Blood pressure 124/60, pulse 77, height 5\' 6"  (1.676 m), weight 141 lb (64 kg), SpO2 93 %.Body  mass index is 22.76 kg/m.  General Appearance: Fairly Groomed  Eye Contact:  Good  Speech:  Clear and Coherent  Volume:  Normal  Mood:  Anxious and Depressed  Affect:  irritable  Thought Process:  Coherent and Goal Directed  Orientation:  Full (Time, Place, and Person)  Thought Content: Logical Perceptions: denies AH/VH  Suicidal Thoughts:  No  Homicidal Thoughts:  No  Memory:  Immediate;   Good Recent;   Good Remote;   Good  Judgement:  Fair  Insight:  Shallow  Psychomotor Activity:  Normal  Concentration:  Concentration: Good and Attention Span: Good  Recall:  Good  Fund of Knowledge: Good  Language: Good  Akathisia:  No  Handed:  Right  AIMS (if indicated):  N/A  Assets:  Communication Skills Desire for Improvement  ADL's:  Intact  Cognition: WNL  Sleep:  poor   Assessment Lenox AhrDoris R Whiteman is a 71 year old female with CAD with history of NSTEMI and PCI, COPD, chronic pain, hyperlipidemia, tobacco use, who presents for follow up appointment for anxiety. Psychosocial stressors including death of the grandson of her brother and chronic pain.   # Generalized anxiety disorder # r/o MDD Patient continues to endorse anxiety and neurovegetative symptoms. Will try duloxetine to target her mood and pain. She is advised to discontinue this medication if she has drowsiness from this medication (which she may experienced from sertraline).  Discussed in length regarding the rationale of not switching back to Xanax but will continue lorazepam prn for a short term for anxiety. Discussed risk of dependence, sedation and respiratory suppression with concomitant use of opioids. Although she will greatly benefit from supportive therapy/CBT, she is not amenable to the option due to financial strain. Will discuss as needed.   Plan 1. Start duloxetine 20 mg daily 2. Discontinue sertraline 3. Continue lorazepam 0.5 mg three times a day as needed for anxiety 4. Return to clinic in one month for 30  mins  The patient demonstrates the following risk factors for suicide: Chronic risk factors for suicide include: psychiatric disorder of anxiety and chronic pain. Acute risk factors for suicide include: family or marital conflict, unemployment and social withdrawal/isolation. Protective factors for this patient include: responsibility to others (children, family), coping skills and hope for the future. Considering these factors, the overall suicide risk at this point appears to be low. Patient is appropriate for outpatient follow up.   Treatment Plan Summary: Plan as above  The duration of this appointment visit was 30 minutes of face-to-face time with the patient.  Greater than 50% of this time was spent in counseling, explanation of  diagnosis, planning of further management, and coordination of care.  Neysa Hottereina Mai Longnecker, MD 06/30/2016, 3:02 PM

## 2016-06-30 NOTE — Patient Instructions (Signed)
1. Start duloxetine 20 mg daily 2. Discontinue sertraline 3. Continue lorazepam 0.5 mg three times a day as needed for anxiety 4. Return to clinic in one month

## 2016-07-22 NOTE — Progress Notes (Deleted)
BH MD/PA/NP OP Progress Note  07/22/2016 9:49 AM [name]  MRN:  [id number]  Chief Complaint:   Subjective:  "Nobody cares about me." HPI:  Patient presents for follow up appointment. She states that she feels out of it, when she takes Ativan while mowing a yard. She states that she continues to take ativan 0.5 mg TID (instructed by her pain doctor) and discontinued sertraline with concern for drowsiness. She denies any dizziness since then. She feels frustrated that she continues to feel anxious and wants to get back on Xanax, as she used to do better. She feels that nobody cares about her and feels upset. She states that her grand daughter, age 72 visits her almost everyday. Although she enjoys being with her, she occasionally feels irritable. She reports good support from people at church. She takes care of her son with TBI. She feels depressed every day. She has low energy. She denies SI. She has panic attacks a few times per week. She complains of arm pain and numbness on her hand.   Per [facility] database:  06/29/2016 HYDROCODONE- ACETAMIN 7.5- 325 by Craig GuessWINIKUR Al CorpusLAWRENCE JAY MD 06/01/2016 LORAZEPAM 1 MG TABLET 135 tabs by  Kolter Reaver  Visit Diagnosis:  No diagnosis found.  Past Psychiatric History:  Outpatient: denies Psychiatry admission: denies Previous suicide attempt: denies Past trials of medication: fluoxetine (nausea), sertraline (dizziness) History of violence: denies  Past Medical History:  Past Medical History:  Diagnosis Date  . CAD (coronary artery disease)    [facility]: s/p non ST segment elevation myocardial infarction in March, 2005 (Cypher drug eluting stent to circumflex and POBA of OM2, borderline obstructive disease in LAD end borderline disease in RCA) O escalation of angina  . Chronic pain syndrome   . COPD (chronic obstructive pulmonary disease) (HCC)   . Diabetes (HCC)   . GERD (gastroesophageal reflux disease)   . Hyperlipidemia   . Neuropathy   .  Tobacco abuse    No past surgical history on file.  Family Psychiatric History:  Denies,  Family History:  Family History  Problem Relation Age of Onset  . Colon cancer Mother     Social History:  Social History   Social History  . Marital status: Married    Spouse name: N/A  . Number of children: N/A  . Years of education: N/A   Social History Main Topics  . Smoking status: Current Every Day Smoker    Packs/day: 0.75    Types: Cigarettes    Start date: 04/28/1959  . Smokeless tobacco: Never Used  . Alcohol use Not on file  . Drug use: Unknown  . Sexual activity: Not on file   Other Topics Concern  . Not on file   Social History Narrative  . 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 Metformin And Related Diarrhea    Metabolic Disorder Labs: No results found for: HGBA1C, MPG No results found for: PROLACTIN No results found for: CHOL, TRIG, HDL, CHOLHDL, VLDL, LDLCALC   Current Medications: Current Outpatient Prescriptions  Medication Sig Dispense Refill  . albuterol (PROAIR HFA) 108 (90 Base) MCG/ACT inhaler Inhale 2 puffs into the lungs every 6 (six) hours as needed for wheezing or shortness of breath.    .Marland Kitchen  aspirin EC 81 MG tablet Take 81 mg by mouth daily.    Marland Kitchen atorvastatin (LIPITOR) 20 MG tablet Take 1 tablet by mouth daily.    . benazepril (LOTENSIN) 5 MG tablet Take 5 mg by mouth daily.    . calcium carbonate (OSCAL) 1500 (600 Ca) MG TABS tablet Take 600 mg of elemental calcium by mouth 2 (two) times daily with a meal.    . clopidogrel (PLAVIX) 75 MG tablet Take 75 mg by mouth daily.    . DULoxetine (CYMBALTA) 20 MG capsule Take 1 capsule (20 mg total) by mouth daily. 30 capsule 1  . Fluticasone-Salmeterol (ADVAIR DISKUS) 250-50 MCG/DOSE  AEPB Inhale 1 puff into the lungs 2 (two) times daily.    Marland Kitchen gabapentin (NEURONTIN) 300 MG capsule Take 600 mg by mouth 3 (three) times daily.    Marland Kitchen glimepiride (AMARYL) 4 MG tablet Take 4 mg by mouth 2 (two) times daily.    Marland Kitchen HYDROcodone-acetaminophen (NORCO) 7.5-325 MG tablet Take 1 tablet by mouth 3 (three) times daily as needed for moderate pain.    . Insulin Detemir (LEVEMIR FLEXPEN) 100 UNIT/ML Pen Inject 40 Units into the skin daily.    Marland Kitchen LORazepam (ATIVAN) 1 MG tablet 1-1.5 mg three times a day as needed for anxiety 135 tablet 0  . metoprolol (LOPRESSOR) 50 MG tablet Take 0.5 tablets by mouth 2 (two) times daily.    . mirabegron ER (MYRBETRIQ) 25 MG TB24 tablet Take 25 mg by mouth daily.    . nitroGLYCERIN (NITROSTAT) 0.4 MG SL tablet Place 0.4 mg under the tongue every 5 (five) minutes as needed for chest pain.    Marland Kitchen oxymetazoline (AFRIN) 0.05 % nasal spray Place 1 spray into both nostrils 2 (two) times daily.    . pantoprazole (PROTONIX) 40 MG tablet Take 40 mg by mouth 2 (two) times daily.    . sitaGLIPtin (JANUVIA) 100 MG tablet Take 100 mg by mouth daily.    . vitamin B-12 (CYANOCOBALAMIN) 500 MCG tablet Take 500 mcg by mouth daily.     No current facility-administered medications for this visit.     Neurologic: Headache: No Seizure: No Paresthesias: No  Musculoskeletal: Strength & Muscle Tone: within normal limits Gait & Station: normal Patient leans: N/A  Psychiatric Specialty Exam: Review of Systems  Musculoskeletal: Positive for back pain and neck pain.  Neurological: Positive for sensory change.  Psychiatric/Behavioral: Positive for depression. Negative for hallucinations, substance abuse and suicidal ideas. The patient is nervous/anxious and has insomnia.   All other systems reviewed and are negative.   There were no vitals taken for this visit.There is no height or weight on file to calculate BMI.  General Appearance: Fairly Groomed  Eye Contact:  Good  Speech:   Clear and Coherent  Volume:  Normal  Mood:  Anxious and Depressed  Affect:  irritable  Thought Process:  Coherent and Goal Directed  Orientation:  Full (Time, Place, and Person)  Thought Content: Logical Perceptions: denies AH/VH  Suicidal Thoughts:  No  Homicidal Thoughts:  No  Memory:  Immediate;   Good Recent;   Good Remote;   Good  Judgement:  Fair  Insight:  Shallow  Psychomotor Activity:  Normal  Concentration:  Concentration: Good and Attention Span: Good  Recall:  Good  Fund of Knowledge: Good  Language: Good  Akathisia:  No  Handed:  Right  AIMS (if indicated):  N/A  Assets:  Communication Skills Desire for Improvement  ADL's:  Intact  Cognition:  WNL  Sleep:  poor   Assessment TYA HAUGHEY is a 71 year old female with CAD with history of NSTEMI and PCI, COPD, chronic pain, hyperlipidemia, tobacco use, who presents for follow up appointment for anxiety. Psychosocial stressors including death of the grandson of her brother and chronic pain.   # Generalized anxiety disorder # r/o MDD Patient continues to endorse anxiety and neurovegetative symptoms. Will try duloxetine to target her mood and pain. She is advised to discontinue this medication if she has drowsiness from this medication (which she may experienced from sertraline).  Discussed in length regarding the rationale of not switching back to Xanax but will continue lorazepam prn for a short term for anxiety. Discussed risk of dependence, sedation and respiratory suppression with concomitant use of opioids. Although she will greatly benefit from supportive therapy/CBT, she is not amenable to the option due to financial strain. Will discuss as needed.   Plan 1. Start duloxetine 20 mg daily 2. Discontinue sertraline 3. Continue lorazepam 0.5 mg three times a day as needed for anxiety 4. Return to clinic in one month for 30 mins  The patient demonstrates the following risk factors for suicide: Chronic risk factors  for suicide include: psychiatric disorder of anxiety and chronic pain. Acute risk factors for suicide include: family or marital conflict, unemployment and social withdrawal/isolation. Protective factors for this patient include: responsibility to others (children, family), coping skills and hope for the future. Considering these factors, the overall suicide risk at this point appears to be low. Patient is appropriate for outpatient follow up.   Treatment Plan Summary: Plan as above  The duration of this appointment visit was 30 minutes of face-to-face time with the patient.  Greater than 50% of this time was spent in counseling, explanation of  diagnosis, planning of further management, and coordination of care.  Neysa Hotter, MD 07/22/2016, 9:49 AM

## 2016-07-27 ENCOUNTER — Ambulatory Visit (HOSPITAL_COMMUNITY): Payer: Self-pay | Admitting: Psychiatry
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
## 2017-10-11 ENCOUNTER — Ambulatory Visit (INDEPENDENT_AMBULATORY_CARE_PROVIDER_SITE_OTHER): Payer: Medicare Other | Admitting: Physician Assistant

## 2017-10-11 ENCOUNTER — Encounter: Payer: Self-pay | Admitting: Physician Assistant

## 2017-10-11 VITALS — BP 130/72 | HR 84 | Ht 66.0 in | Wt 141.0 lb

## 2017-10-11 DIAGNOSIS — E782 Mixed hyperlipidemia: Secondary | ICD-10-CM

## 2017-10-11 DIAGNOSIS — I2510 Atherosclerotic heart disease of native coronary artery without angina pectoris: Secondary | ICD-10-CM | POA: Diagnosis not present

## 2017-10-11 DIAGNOSIS — Z720 Tobacco use: Secondary | ICD-10-CM

## 2017-10-11 DIAGNOSIS — R55 Syncope and collapse: Secondary | ICD-10-CM

## 2017-10-11 DIAGNOSIS — I471 Supraventricular tachycardia, unspecified: Secondary | ICD-10-CM

## 2017-10-11 DIAGNOSIS — E785 Hyperlipidemia, unspecified: Secondary | ICD-10-CM | POA: Insufficient documentation

## 2017-10-11 DIAGNOSIS — E119 Type 2 diabetes mellitus without complications: Secondary | ICD-10-CM | POA: Insufficient documentation

## 2017-10-11 MED ORDER — METOPROLOL TARTRATE 50 MG PO TABS: ORAL_TABLET | ORAL | 6 refills | Status: AC

## 2017-10-11 NOTE — Patient Instructions (Signed)
Medication Instructions:  Increase metoprolol to 50 mg in the morning and 25 mg in the evening   Labwork: none  Testing/Procedures: Your physician has requested that you have a lexiscan myoview. For further information please visit [email or web address] Please follow instruction sheet, as given.    Follow-Up: Your physician recommends that you schedule a follow-up appointment in:  With DR. KONESWARAN AFTER STRESS TEST    Any Other Special Instructions Will Be Listed Below (If Applicable).     If you need a refill on your cardiac medications before your next appointment, please call your pharmacy.

## 2017-10-15 ENCOUNTER — Encounter (HOSPITAL_COMMUNITY): Payer: Self-pay

## 2017-10-15 ENCOUNTER — Ambulatory Visit (HOSPITAL_COMMUNITY): Admission: RE | Admit: 2017-10-15 | Payer: Medicare Other | Source: Ambulatory Visit

## 2017-12-14 ENCOUNTER — Encounter: Payer: Self-pay | Admitting: *Deleted

## 2017-12-15 ENCOUNTER — Ambulatory Visit: Payer: Medicare Other | Admitting: Cardiovascular Disease

## 2018-01-11 ENCOUNTER — Encounter: Payer: Self-pay | Admitting: Cardiovascular Disease

## 2018-01-11 ENCOUNTER — Encounter

## 2018-01-11 ENCOUNTER — Ambulatory Visit (INDEPENDENT_AMBULATORY_CARE_PROVIDER_SITE_OTHER): Payer: Medicare Other | Admitting: Cardiovascular Disease

## 2018-01-11 VITALS — BP 100/46 | HR 72 | Ht 66.0 in | Wt 132.0 lb

## 2018-01-11 DIAGNOSIS — I10 Essential (primary) hypertension: Secondary | ICD-10-CM | POA: Diagnosis not present

## 2018-01-11 DIAGNOSIS — E785 Hyperlipidemia, unspecified: Secondary | ICD-10-CM | POA: Diagnosis not present

## 2018-01-11 DIAGNOSIS — Z720 Tobacco use: Secondary | ICD-10-CM | POA: Diagnosis not present

## 2018-01-11 DIAGNOSIS — I25118 Atherosclerotic heart disease of native coronary artery with other forms of angina pectoris: Secondary | ICD-10-CM

## 2018-01-11 DIAGNOSIS — R55 Syncope and collapse: Secondary | ICD-10-CM

## 2018-01-11 DIAGNOSIS — I471 Supraventricular tachycardia: Secondary | ICD-10-CM

## 2018-01-11 NOTE — Patient Instructions (Addendum)
Medication Instructions:   Your physician has recommended you make the following change in your medication:   Stop benazepril 5 mg  Continue all other medications the same.  Labwork:  NONE  Testing/Procedures:  NONE  Follow-Up:  Your physician recommends that you schedule a follow-up appointment in: 6 months. You will receive a reminder letter in the mail in about 4 months reminding you to call and schedule your appointment. If you don't receive this letter, please contact our office.  Any Other Special Instructions Will Be Listed Below (If Applicable).  If you need a refill on your cardiac medications before your next appointment, please call your pharmacy.
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
## 2018-01-30 DIAGNOSIS — N390 Urinary tract infection, site not specified: Secondary | ICD-10-CM | POA: Diagnosis not present

## 2018-01-30 DIAGNOSIS — R638 Other symptoms and signs concerning food and fluid intake: Secondary | ICD-10-CM | POA: Diagnosis not present

## 2018-01-30 DIAGNOSIS — E11649 Type 2 diabetes mellitus with hypoglycemia without coma: Secondary | ICD-10-CM | POA: Diagnosis not present

## 2018-01-30 DIAGNOSIS — E222 Syndrome of inappropriate secretion of antidiuretic hormone: Secondary | ICD-10-CM | POA: Diagnosis not present

## 2018-01-30 DIAGNOSIS — R633 Feeding difficulties: Secondary | ICD-10-CM | POA: Diagnosis not present

## 2018-01-30 DIAGNOSIS — E063 Autoimmune thyroiditis: Secondary | ICD-10-CM | POA: Diagnosis not present

## 2018-01-30 DIAGNOSIS — I499 Cardiac arrhythmia, unspecified: Secondary | ICD-10-CM | POA: Diagnosis not present

## 2018-01-30 DIAGNOSIS — F17210 Nicotine dependence, cigarettes, uncomplicated: Secondary | ICD-10-CM | POA: Diagnosis not present

## 2018-01-30 DIAGNOSIS — I493 Ventricular premature depolarization: Secondary | ICD-10-CM | POA: Diagnosis not present

## 2018-01-30 DIAGNOSIS — R935 Abnormal findings on diagnostic imaging of other abdominal regions, including retroperitoneum: Secondary | ICD-10-CM | POA: Diagnosis not present

## 2018-01-30 DIAGNOSIS — E119 Type 2 diabetes mellitus without complications: Secondary | ICD-10-CM | POA: Diagnosis not present

## 2018-01-30 DIAGNOSIS — R279 Unspecified lack of coordination: Secondary | ICD-10-CM | POA: Diagnosis not present

## 2018-01-30 DIAGNOSIS — R930 Abnormal findings on diagnostic imaging of skull and head, not elsewhere classified: Secondary | ICD-10-CM | POA: Diagnosis not present

## 2018-01-30 DIAGNOSIS — K219 Gastro-esophageal reflux disease without esophagitis: Secondary | ICD-10-CM | POA: Diagnosis not present

## 2018-01-30 DIAGNOSIS — Z794 Long term (current) use of insulin: Secondary | ICD-10-CM | POA: Diagnosis not present

## 2018-01-30 DIAGNOSIS — R9431 Abnormal electrocardiogram [ECG] [EKG]: Secondary | ICD-10-CM | POA: Diagnosis not present

## 2018-01-30 DIAGNOSIS — E785 Hyperlipidemia, unspecified: Secondary | ICD-10-CM | POA: Diagnosis not present

## 2018-01-30 DIAGNOSIS — Z431 Encounter for attention to gastrostomy: Secondary | ICD-10-CM | POA: Diagnosis not present

## 2018-01-30 DIAGNOSIS — Z743 Need for continuous supervision: Secondary | ICD-10-CM | POA: Diagnosis not present

## 2018-01-30 DIAGNOSIS — J449 Chronic obstructive pulmonary disease, unspecified: Secondary | ICD-10-CM | POA: Diagnosis not present

## 2018-01-30 DIAGNOSIS — G92 Toxic encephalopathy: Secondary | ICD-10-CM | POA: Diagnosis not present

## 2018-01-30 DIAGNOSIS — E162 Hypoglycemia, unspecified: Secondary | ICD-10-CM | POA: Diagnosis not present

## 2018-01-30 DIAGNOSIS — B379 Candidiasis, unspecified: Secondary | ICD-10-CM | POA: Diagnosis not present

## 2018-01-30 DIAGNOSIS — E161 Other hypoglycemia: Secondary | ICD-10-CM | POA: Diagnosis not present

## 2018-01-30 DIAGNOSIS — G9340 Encephalopathy, unspecified: Secondary | ICD-10-CM | POA: Diagnosis not present

## 2018-01-30 DIAGNOSIS — F0390 Unspecified dementia without behavioral disturbance: Secondary | ICD-10-CM | POA: Diagnosis not present

## 2018-01-30 DIAGNOSIS — I10 Essential (primary) hypertension: Secondary | ICD-10-CM | POA: Diagnosis not present

## 2018-01-30 DIAGNOSIS — Z955 Presence of coronary angioplasty implant and graft: Secondary | ICD-10-CM | POA: Diagnosis not present

## 2018-01-30 DIAGNOSIS — R911 Solitary pulmonary nodule: Secondary | ICD-10-CM | POA: Diagnosis not present

## 2018-01-30 DIAGNOSIS — B370 Candidal stomatitis: Secondary | ICD-10-CM | POA: Diagnosis not present

## 2018-01-30 DIAGNOSIS — G9349 Other encephalopathy: Secondary | ICD-10-CM | POA: Diagnosis not present

## 2018-01-30 DIAGNOSIS — R1312 Dysphagia, oropharyngeal phase: Secondary | ICD-10-CM | POA: Diagnosis not present

## 2018-01-30 DIAGNOSIS — I2510 Atherosclerotic heart disease of native coronary artery without angina pectoris: Secondary | ICD-10-CM | POA: Diagnosis not present

## 2018-01-30 DIAGNOSIS — R1310 Dysphagia, unspecified: Secondary | ICD-10-CM | POA: Diagnosis not present

## 2018-02-25 DIAGNOSIS — I2510 Atherosclerotic heart disease of native coronary artery without angina pectoris: Secondary | ICD-10-CM | POA: Diagnosis not present

## 2018-02-25 DIAGNOSIS — J984 Other disorders of lung: Secondary | ICD-10-CM | POA: Diagnosis not present

## 2018-02-25 DIAGNOSIS — Z794 Long term (current) use of insulin: Secondary | ICD-10-CM | POA: Diagnosis not present

## 2018-02-25 DIAGNOSIS — G9349 Other encephalopathy: Secondary | ICD-10-CM | POA: Diagnosis not present

## 2018-02-26 DIAGNOSIS — I2510 Atherosclerotic heart disease of native coronary artery without angina pectoris: Secondary | ICD-10-CM | POA: Diagnosis not present

## 2018-02-26 DIAGNOSIS — R0902 Hypoxemia: Secondary | ICD-10-CM | POA: Diagnosis not present

## 2018-02-26 DIAGNOSIS — R1310 Dysphagia, unspecified: Secondary | ICD-10-CM | POA: Diagnosis not present

## 2018-02-26 DIAGNOSIS — N390 Urinary tract infection, site not specified: Secondary | ICD-10-CM | POA: Diagnosis not present

## 2018-02-26 DIAGNOSIS — Z4682 Encounter for fitting and adjustment of non-vascular catheter: Secondary | ICD-10-CM | POA: Diagnosis not present

## 2018-02-26 DIAGNOSIS — I259 Chronic ischemic heart disease, unspecified: Secondary | ICD-10-CM | POA: Diagnosis not present

## 2018-02-26 DIAGNOSIS — Z743 Need for continuous supervision: Secondary | ICD-10-CM | POA: Diagnosis not present

## 2018-02-26 DIAGNOSIS — R509 Fever, unspecified: Secondary | ICD-10-CM | POA: Diagnosis not present

## 2018-02-26 DIAGNOSIS — S81801A Unspecified open wound, right lower leg, initial encounter: Secondary | ICD-10-CM | POA: Diagnosis not present

## 2018-02-26 DIAGNOSIS — I493 Ventricular premature depolarization: Secondary | ICD-10-CM | POA: Diagnosis not present

## 2018-02-26 DIAGNOSIS — R739 Hyperglycemia, unspecified: Secondary | ICD-10-CM | POA: Diagnosis not present

## 2018-02-26 DIAGNOSIS — Z9981 Dependence on supplemental oxygen: Secondary | ICD-10-CM | POA: Diagnosis not present

## 2018-02-26 DIAGNOSIS — R4020 Unspecified coma: Secondary | ICD-10-CM | POA: Diagnosis not present

## 2018-02-26 DIAGNOSIS — F17210 Nicotine dependence, cigarettes, uncomplicated: Secondary | ICD-10-CM | POA: Diagnosis not present

## 2018-02-26 DIAGNOSIS — Z515 Encounter for palliative care: Secondary | ICD-10-CM | POA: Diagnosis not present

## 2018-02-26 DIAGNOSIS — R069 Unspecified abnormalities of breathing: Secondary | ICD-10-CM | POA: Diagnosis not present

## 2018-02-26 DIAGNOSIS — J9601 Acute respiratory failure with hypoxia: Secondary | ICD-10-CM | POA: Diagnosis not present

## 2018-02-26 DIAGNOSIS — S81812A Laceration without foreign body, left lower leg, initial encounter: Secondary | ICD-10-CM | POA: Diagnosis not present

## 2018-02-26 DIAGNOSIS — R931 Abnormal findings on diagnostic imaging of heart and coronary circulation: Secondary | ICD-10-CM | POA: Diagnosis not present

## 2018-02-26 DIAGNOSIS — R0603 Acute respiratory distress: Secondary | ICD-10-CM | POA: Diagnosis not present

## 2018-02-26 DIAGNOSIS — D72829 Elevated white blood cell count, unspecified: Secondary | ICD-10-CM | POA: Diagnosis not present

## 2018-02-26 DIAGNOSIS — I21A1 Myocardial infarction type 2: Secondary | ICD-10-CM | POA: Diagnosis not present

## 2018-02-26 DIAGNOSIS — R0682 Tachypnea, not elsewhere classified: Secondary | ICD-10-CM | POA: Diagnosis not present

## 2018-02-26 DIAGNOSIS — R9431 Abnormal electrocardiogram [ECG] [EKG]: Secondary | ICD-10-CM | POA: Diagnosis not present

## 2018-02-26 DIAGNOSIS — G9341 Metabolic encephalopathy: Secondary | ICD-10-CM | POA: Diagnosis not present

## 2018-02-26 DIAGNOSIS — X58XXXA Exposure to other specified factors, initial encounter: Secondary | ICD-10-CM | POA: Diagnosis not present

## 2018-02-26 DIAGNOSIS — T83511A Infection and inflammatory reaction due to indwelling urethral catheter, initial encounter: Secondary | ICD-10-CM | POA: Diagnosis not present

## 2018-02-26 DIAGNOSIS — K117 Disturbances of salivary secretion: Secondary | ICD-10-CM | POA: Diagnosis not present

## 2018-02-26 DIAGNOSIS — G9349 Other encephalopathy: Secondary | ICD-10-CM | POA: Diagnosis not present

## 2018-02-26 DIAGNOSIS — R404 Transient alteration of awareness: Secondary | ICD-10-CM | POA: Diagnosis not present

## 2018-02-26 DIAGNOSIS — I499 Cardiac arrhythmia, unspecified: Secondary | ICD-10-CM | POA: Diagnosis not present

## 2018-02-26 DIAGNOSIS — J90 Pleural effusion, not elsewhere classified: Secondary | ICD-10-CM | POA: Diagnosis not present

## 2018-02-26 DIAGNOSIS — Z9861 Coronary angioplasty status: Secondary | ICD-10-CM | POA: Diagnosis not present

## 2018-02-26 DIAGNOSIS — R279 Unspecified lack of coordination: Secondary | ICD-10-CM | POA: Diagnosis not present

## 2018-02-26 DIAGNOSIS — K668 Other specified disorders of peritoneum: Secondary | ICD-10-CM | POA: Diagnosis not present

## 2018-02-26 DIAGNOSIS — D509 Iron deficiency anemia, unspecified: Secondary | ICD-10-CM | POA: Diagnosis not present

## 2018-02-26 DIAGNOSIS — J811 Chronic pulmonary edema: Secondary | ICD-10-CM | POA: Diagnosis not present

## 2018-02-26 DIAGNOSIS — E119 Type 2 diabetes mellitus without complications: Secondary | ICD-10-CM | POA: Diagnosis not present

## 2018-02-26 DIAGNOSIS — I5181 Takotsubo syndrome: Secondary | ICD-10-CM | POA: Diagnosis not present

## 2018-02-26 DIAGNOSIS — Z7189 Other specified counseling: Secondary | ICD-10-CM | POA: Diagnosis not present

## 2018-02-26 DIAGNOSIS — Z794 Long term (current) use of insulin: Secondary | ICD-10-CM | POA: Diagnosis not present

## 2018-02-26 DIAGNOSIS — E11649 Type 2 diabetes mellitus with hypoglycemia without coma: Secondary | ICD-10-CM | POA: Diagnosis not present

## 2018-02-26 DIAGNOSIS — J810 Acute pulmonary edema: Secondary | ICD-10-CM | POA: Diagnosis not present

## 2018-02-26 DIAGNOSIS — R1900 Intra-abdominal and pelvic swelling, mass and lump, unspecified site: Secondary | ICD-10-CM | POA: Diagnosis not present

## 2018-02-26 DIAGNOSIS — G9340 Encephalopathy, unspecified: Secondary | ICD-10-CM | POA: Diagnosis not present

## 2018-02-26 DIAGNOSIS — Z931 Gastrostomy status: Secondary | ICD-10-CM | POA: Diagnosis not present

## 2018-02-26 DIAGNOSIS — R935 Abnormal findings on diagnostic imaging of other abdominal regions, including retroperitoneum: Secondary | ICD-10-CM | POA: Diagnosis not present

## 2018-02-26 DIAGNOSIS — N10 Acute tubulo-interstitial nephritis: Secondary | ICD-10-CM | POA: Diagnosis not present

## 2018-02-26 DIAGNOSIS — S81811A Laceration without foreign body, right lower leg, initial encounter: Secondary | ICD-10-CM | POA: Diagnosis not present

## 2018-02-26 DIAGNOSIS — R000 Tachycardia, unspecified: Secondary | ICD-10-CM | POA: Diagnosis not present

## 2018-02-26 DIAGNOSIS — I10 Essential (primary) hypertension: Secondary | ICD-10-CM | POA: Diagnosis not present

## 2018-02-26 DIAGNOSIS — I214 Non-ST elevation (NSTEMI) myocardial infarction: Secondary | ICD-10-CM | POA: Diagnosis not present

## 2018-02-26 DIAGNOSIS — I358 Other nonrheumatic aortic valve disorders: Secondary | ICD-10-CM | POA: Diagnosis not present

## 2018-02-26 DIAGNOSIS — A419 Sepsis, unspecified organism: Secondary | ICD-10-CM | POA: Diagnosis not present

## 2018-02-26 DIAGNOSIS — S81802A Unspecified open wound, left lower leg, initial encounter: Secondary | ICD-10-CM | POA: Diagnosis not present

## 2018-02-26 DIAGNOSIS — I959 Hypotension, unspecified: Secondary | ICD-10-CM | POA: Diagnosis not present

## 2018-02-26 DIAGNOSIS — E785 Hyperlipidemia, unspecified: Secondary | ICD-10-CM | POA: Diagnosis not present

## 2018-02-26 DIAGNOSIS — J449 Chronic obstructive pulmonary disease, unspecified: Secondary | ICD-10-CM | POA: Diagnosis not present

## 2018-02-26 DIAGNOSIS — K659 Peritonitis, unspecified: Secondary | ICD-10-CM | POA: Diagnosis not present

## 2018-02-26 DIAGNOSIS — E11641 Type 2 diabetes mellitus with hypoglycemia with coma: Secondary | ICD-10-CM | POA: Diagnosis not present

## 2018-02-26 DIAGNOSIS — R7989 Other specified abnormal findings of blood chemistry: Secondary | ICD-10-CM | POA: Diagnosis not present

## 2018-02-26 DIAGNOSIS — R5081 Fever presenting with conditions classified elsewhere: Secondary | ICD-10-CM | POA: Diagnosis not present

## 2018-02-26 DIAGNOSIS — E1165 Type 2 diabetes mellitus with hyperglycemia: Secondary | ICD-10-CM | POA: Diagnosis not present

## 2018-02-26 DIAGNOSIS — Y998 Other external cause status: Secondary | ICD-10-CM | POA: Diagnosis not present

## 2018-02-26 DIAGNOSIS — I351 Nonrheumatic aortic (valve) insufficiency: Secondary | ICD-10-CM | POA: Diagnosis not present

## 2018-02-26 DIAGNOSIS — Z431 Encounter for attention to gastrostomy: Secondary | ICD-10-CM | POA: Diagnosis not present

## 2018-02-26 DIAGNOSIS — R918 Other nonspecific abnormal finding of lung field: Secondary | ICD-10-CM | POA: Diagnosis not present

## 2018-02-26 DIAGNOSIS — R109 Unspecified abdominal pain: Secondary | ICD-10-CM | POA: Diagnosis not present

## 2018-02-26 DIAGNOSIS — R451 Restlessness and agitation: Secondary | ICD-10-CM | POA: Diagnosis not present

## 2018-02-26 DIAGNOSIS — I4891 Unspecified atrial fibrillation: Secondary | ICD-10-CM | POA: Diagnosis not present

## 2018-02-26 DIAGNOSIS — Z9889 Other specified postprocedural states: Secondary | ICD-10-CM | POA: Diagnosis not present

## 2018-02-26 DIAGNOSIS — K9423 Gastrostomy malfunction: Secondary | ICD-10-CM | POA: Diagnosis not present

## 2018-02-26 DIAGNOSIS — E873 Alkalosis: Secondary | ICD-10-CM | POA: Diagnosis not present

## 2018-02-26 DIAGNOSIS — R410 Disorientation, unspecified: Secondary | ICD-10-CM | POA: Diagnosis not present

## 2018-02-28 ENCOUNTER — Other Ambulatory Visit: Payer: Self-pay

## 2018-02-28 NOTE — Patient Outreach (Signed)
[facility]) Care Management  02/28/2018  Elaine Henson 08-01-1945 [id number]     Transition of Care Referral  Referral Date: 02/28/2018  Referral Source: [facility] Discharge Report Date of Admission: unknown Diagnosis: Date of Discharge: 02/24/2018 Facility: [facility] Insurance: [facility]    Outreach attempt # 1 to patient. Someone anwered the phone but would not say anything for several seconds and then hung up. RN CM called number back and got same response.     Plan: RN CM will make outreach attempt to patient within 3-4 business days. RN CM will send unsuccessful outreach letter to patient.   [name], RN,BSN,CCM [facility] Care Management Telephonic Care Management Coordinator Direct Phone: [phone] Toll Free: [phone] Fax: [phone]

## 2018-03-01 ENCOUNTER — Other Ambulatory Visit: Payer: Self-pay

## 2018-03-01 MED ORDER — INSULIN GLARGINE 100 UNIT/ML SOLOSTAR PEN
20.00 | PEN_INJECTOR | SUBCUTANEOUS | Status: DC
Start: 2018-03-01 — End: 2018-03-01

## 2018-03-01 MED ORDER — METOPROLOL TARTRATE 50 MG PO TABS
50.00 | ORAL_TABLET | ORAL | Status: DC
Start: 2018-03-02 — End: 2018-03-01

## 2018-03-01 MED ORDER — ACETAMINOPHEN 650 MG RE SUPP
650.00 | RECTAL | Status: DC
Start: ? — End: 2018-03-01

## 2018-03-01 MED ORDER — TRAZODONE HCL 50 MG PO TABS
50.00 | ORAL_TABLET | ORAL | Status: DC
Start: 2018-03-01 — End: 2018-03-01

## 2018-03-01 MED ORDER — HALOPERIDOL LACTATE 2 MG/ML PO CONC
2.00 | ORAL | Status: DC
Start: ? — End: 2018-03-01

## 2018-03-01 MED ORDER — INSULIN LISPRO 100 UNIT/ML ~~LOC~~ SOLN
1.00 | SUBCUTANEOUS | Status: DC
Start: 2018-03-01 — End: 2018-03-01

## 2018-03-01 MED ORDER — LORAZEPAM 1 MG PO TABS
1.00 | ORAL_TABLET | ORAL | Status: DC
Start: ? — End: 2018-03-01

## 2018-03-01 MED ORDER — GENERIC EXTERNAL MEDICATION
100.00 | Status: DC
Start: 2018-03-01 — End: 2018-03-01

## 2018-03-01 MED ORDER — FOLIC ACID 1 MG PO TABS
1.00 | ORAL_TABLET | ORAL | Status: DC
Start: 2018-03-01 — End: 2018-03-01

## 2018-03-01 MED ORDER — ALBUTEROL SULFATE (2.5 MG/3ML) 0.083% IN NEBU
2.50 | INHALATION_SOLUTION | RESPIRATORY_TRACT | Status: DC
Start: ? — End: 2018-03-01

## 2018-03-01 MED ORDER — ENOXAPARIN SODIUM 40 MG/0.4ML ~~LOC~~ SOLN
40.00 | SUBCUTANEOUS | Status: DC
Start: 2018-03-02 — End: 2018-03-01

## 2018-03-01 MED ORDER — DEXTROSE 10 % IV SOLN
125.00 | INTRAVENOUS | Status: DC
Start: ? — End: 2018-03-01

## 2018-03-01 MED ORDER — METOPROLOL TARTRATE 25 MG PO TABS
25.00 | ORAL_TABLET | ORAL | Status: DC
Start: 2018-03-01 — End: 2018-03-01

## 2018-03-01 MED ORDER — AMANTADINE HCL 100 MG PO CAPS
100.00 | ORAL_CAPSULE | ORAL | Status: DC
Start: 2018-03-01 — End: 2018-03-01

## 2018-03-01 MED ORDER — ASPIRIN 81 MG PO CHEW
81.00 | CHEWABLE_TABLET | ORAL | Status: DC
Start: 2018-03-01 — End: 2018-03-01

## 2018-03-01 MED ORDER — GENERIC EXTERNAL MEDICATION
3.38 | Status: DC
Start: 2018-03-01 — End: 2018-03-01

## 2018-03-01 MED ORDER — ATORVASTATIN CALCIUM 10 MG PO TABS
20.00 | ORAL_TABLET | ORAL | Status: DC
Start: 2018-03-01 — End: 2018-03-01

## 2018-03-01 MED ORDER — GENERIC EXTERNAL MEDICATION
40.00 | Status: DC
Start: 2018-03-01 — End: 2018-03-01

## 2018-03-01 MED ORDER — GENERIC EXTERNAL MEDICATION
Status: DC
Start: 2018-03-01 — End: 2018-03-01

## 2018-03-01 MED ORDER — MELATONIN 3 MG PO TABS
6.00 | ORAL_TABLET | ORAL | Status: DC
Start: 2018-03-01 — End: 2018-03-01

## 2018-03-01 MED ORDER — THIAMINE HCL 100 MG PO TABS
100.00 | ORAL_TABLET | ORAL | Status: DC
Start: 2018-03-01 — End: 2018-03-01

## 2018-03-01 NOTE — Patient Outreach (Signed)
[facility]) Care Management  03/01/2018  Elaine Henson [birth year] [id number]   Transition of Care Referral  Referral Date: 02/28/2018  Referral Source: [facility] Discharge Report Date of Admission: unknown Diagnosis:"encephalopathy unspecified" Date of Discharge: 02/24/2018 Facility: [facility] Insurance: [facility]   Outreach attempt #2 to patient. A female answered the phone and identified himself as patient's son. No PHI given. Son freely reports that patient remains in the hospital at this time.       Plan: RN CM will close case at this time as patient remains inpatient.   [name], RN,BSN,CCM [facility] Care Management Telephonic Care Management Coordinator Direct Phone: [phone] Toll Free: [phone] Fax: [phone]

## 2018-03-09 ENCOUNTER — Ambulatory Visit: Payer: Medicare Other | Admitting: Cardiovascular Disease

## 2018-03-15 ENCOUNTER — Other Ambulatory Visit: Payer: Self-pay

## 2018-03-15 NOTE — Patient Outreach (Signed)
[facility]) Care Management  03/15/2018  Elaine Henson 11/19/1945 [id number]     Transition of Care Referral  Referral Date: 03/15/2018 Referral Source: [facility] Discharge Report Date of Admission: unknown Diagnosis: "UTI" Date of Discharge: 03/11/2018 Facility: [facility] Insurance: [facility]    Outreach attempt # 1 to patient. No answer at present.    Plan: RN CM will make outreach attempt to patient within 3-4 business days. RN CM will send unsuccessful outreach letter to patient.   [name], RN,BSN,CCM [facility] Care Management Telephonic Care Management Coordinator Direct Phone: [phone] Toll Free: [phone] Fax: [phone]

## 2018-03-16 ENCOUNTER — Other Ambulatory Visit: Payer: Self-pay

## 2018-03-16 NOTE — Patient Outreach (Signed)
[facility]) Care Management  03/16/2018  Elaine Henson Jun 09, 1945 [id number]   Transition of Care Referral  Referral Date: 03/15/2018 Referral Source: [facility] Discharge Report Date of Admission: unknown Diagnosis: "UTI" Date of Discharge: 03/11/2018 Facility: [facility] Insurance: [facility]   Outreach attempt #2 to patient. No answer at present. RN CM left HIPAA compliant voicemail message along with contact info.     Plan: RN CM will make outreach attempt to patient within 3-4 business days.   [name], RN,BSN,CCM [facility] Care Management Telephonic Care Management Coordinator Direct Phone: [phone] Toll Free: [phone] Fax: [phone]

## 2018-03-17 ENCOUNTER — Other Ambulatory Visit: Payer: Self-pay

## 2018-03-17 NOTE — Patient Outreach (Signed)
[facility]) Care Management  03/17/2018  Elaine Henson [birth year] [id number]   Transition of Care Referral  Referral Date:03/15/2018 Referral Source:[employer] Discharge Report Date of Admission:unknown Diagnosis:"UTI" Date of Discharge:03/11/2018 Facility:[facility] Insurance:[employer]   Outreach attempt #3 to patient. Call went straight to voicemail.   Plan: RN CM will close case if no response from letter mailed to patient.   [name], RN,BSN,CCM [facility] Care Management Telephonic Care Management Coordinator Direct Phone: [phone] Toll Free: [phone] Fax: [phone]

## 2018-03-27 DEATH — deceased

## 2018-03-29 ENCOUNTER — Other Ambulatory Visit: Payer: Self-pay

## 2018-03-29 NOTE — Patient Outreach (Signed)
[facility]) Care Management  03/29/2018  SHAWNEEQUA VICTORIA Mar 14, 1945 [id number]   Transition of Care Referral  Referral Date:03/15/2018 Referral Source:[employer] Discharge Report Date of Admission:unknown Diagnosis:"UTI" Date of Discharge:03/11/2018 Facility:[facility] Insurance:[employer]   Multiple attempts to establish contact with patient without success. No response from letter mailed to patient. Case is being closed at this time.   Plan: RN CM will close case at this time.  [name], RN,BSN,CCM [facility] Care Management Telephonic Care Management Coordinator Direct Phone: [phone] Toll Free: [phone] Fax: [phone]
